# Patient Record
Sex: Male | Born: 1990 | Race: Black or African American | Hispanic: No | Marital: Single
Health system: Southern US, Community
[De-identification: ages and names within clinical notes are randomized; demographics above are authoritative.]

## PROBLEM LIST (undated history)

## (undated) DIAGNOSIS — K3184 Gastroparesis: Secondary | ICD-10-CM

---

## 2020-10-27 ENCOUNTER — Emergency Department (HOSPITAL_COMMUNITY)
Admission: EM | Admit: 2020-10-27 | Discharge: 2020-10-28 | Disposition: A | Discharge status: Home / Self Care | Attending: Emergency Medicine | Admitting: Emergency Medicine

## 2020-10-27 ENCOUNTER — Encounter (HOSPITAL_COMMUNITY): Payer: Self-pay

## 2020-10-27 ENCOUNTER — Other Ambulatory Visit: Payer: Self-pay

## 2020-10-27 ENCOUNTER — Emergency Department (HOSPITAL_COMMUNITY)

## 2020-10-27 DIAGNOSIS — M545 Low back pain, unspecified: Secondary | ICD-10-CM

## 2020-10-27 HISTORY — DX: Gastroparesis: K31.84

## 2020-10-27 MED ORDER — KETOROLAC TROMETHAMINE 15 MG/ML IJ SOLN
30.0000 mg | Freq: Once | INTRAMUSCULAR | Status: AC
  Administered 2020-10-27: 30 mg via INTRAMUSCULAR
  Filled 2020-10-27: qty 2

## 2020-10-27 MED ORDER — LIDOCAINE 5 % EX PTCH
1.0000 | MEDICATED_PATCH | CUTANEOUS | Status: DC
  Administered 2020-10-27: 1 via TRANSDERMAL
  Filled 2020-10-27: qty 1

## 2020-10-27 MED ORDER — ACETAMINOPHEN 500 MG PO TABS
1000.0000 mg | ORAL_TABLET | Freq: Once | ORAL | Status: AC
  Administered 2020-10-27: 1000 mg via ORAL
  Filled 2020-10-27: qty 2

## 2020-10-27 NOTE — ED Triage Notes (Signed)
Pt reports lower back pain that radiates down his left leg shortly after jumping at a trampoline park. Pt reports he has trouble sitting down.

## 2020-10-27 NOTE — ED Provider Notes (Signed)
[name]-EMERGENCY DEPT Provider Note   CSN: [id number] Arrival date & time: 10/27/20  2238     History Chief Complaint  Patient presents with   Back Pain    Jose Ellison is a 30 y.o. male with a history of gastroparesis who presents to the emergency department with complaints of lower back pain for the past few days.  Patient states that he was at the trampoline park with his children and thinks that he overdid it, he had no overt pain while jumping but once he got off of the trampoline since that down he noted discomfort in the lower back.  Pain has been constant, seems to be worse, intermittently radiating down the left lower extremity.  Has some intermittent paresthesias.  Aggravated by certain positions/movements, alleviated some with an over-the-counter back brace he purchased as well as some Aleve. Denies numbness, significant weakness, saddle anesthesia, incontinence to bowel/bladder, fever, chills, IV drug use, dysuria, or hx of cancer. Patient has not had prior back surgeries.    HPI     Past Medical History:  Diagnosis Date   Gastroparesis     There are no problems to display for this patient.   History reviewed. No pertinent surgical history.     History reviewed. No pertinent family history.     Home Medications Prior to Admission medications   Not on File    Allergies    Patient has no known allergies.  Review of Systems   Review of Systems  Constitutional: Negative for chills, fever and unexpected weight change.  Gastrointestinal: Negative for abdominal pain, nausea and vomiting.  Genitourinary: Negative for dysuria.  Musculoskeletal: Positive for back pain.  Neurological: Negative for weakness and numbness.       Negative for saddle anesthesia or bowel/bladder incontinence.   All other systems reviewed and are negative.   Physical Exam Updated Vital Signs BP 136/82 (BP Location: Left Arm)    Pulse 91    Temp 98.6 F  (37 C) (Oral)    Resp 18    Ht 5\' 9"  (1.753 m)    Wt 121.6 kg    SpO2 97%    BMI 39.58 kg/m   Physical Exam Vitals and nursing note reviewed.  Constitutional:      General: He is not in acute distress.    Appearance: He is well-developed and well-nourished. He is not ill-appearing or toxic-appearing.  HENT:     Head: Normocephalic and atraumatic.  Cardiovascular:     Pulses: Intact distal pulses.          Dorsalis pedis pulses are 2+ on the right side and 2+ on the left side.       Posterior tibial pulses are 2+ on the right side and 2+ on the left side.  Pulmonary:     Effort: Pulmonary effort is normal.  Abdominal:     General: There is no distension.     Palpations: Abdomen is soft.     Tenderness: There is no abdominal tenderness. There is no guarding or rebound.  Musculoskeletal:     Cervical back: Normal range of motion and neck supple. No spinous process tenderness or muscular tenderness.     Comments: Back: Diffuse tenderness to the lumbar region including midline and bilateral paraspinal muscles left greater than right.  No point/focal vertebral tenderness or palpable step-off. Lower extremities: No obvious deformity, appreciable swelling, edema, erythema, ecchymosis, warmth, or open wounds. Patient able to actively move all major joints of  the lower extremity.  He is tender to palpation to the left lateral hip as well as the left gluteal region.  Skin:    General: Skin is warm and dry.     Capillary Refill: Capillary refill takes less than 2 seconds.     Findings: No rash.  Neurological:     Mental Status: He is alert.     Deep Tendon Reflexes:     Reflex Scores:      Patellar reflexes are 2+ on the right side and 2+ on the left side.    Comments: Alert. Clear speech. Sensation grossly intact to bilateral lower extremities. 5/5 strength with plantar/dorsiflexion bilaterally. Patient ambulatory with antalgic gait.  Psychiatric:        Mood and Affect: Mood normal.         Behavior: Behavior normal.     ED Results / Procedures / Treatments   Labs (all labs ordered are listed, but only abnormal results are displayed) Labs Reviewed - No data to display  EKG None  Radiology DG Lumbar Spine Complete  Result Date: 10/27/2020 CLINICAL DATA:  Lumbosacral back pain radiating down left leg. Onset after jumping on trampoline park. EXAM: LUMBAR SPINE - COMPLETE 4+ VIEW COMPARISON:  None. FINDINGS: There are 5 non-rib-bearing lumbar vertebra. S1 appears partially lumbarized. The alignment is maintained. Vertebral body heights are normal. There is no listhesis. The posterior elements are intact. Disc spaces are preserved. No fracture. Sacroiliac joints are symmetric and normal. IMPRESSION: 1. No acute fracture or subluxation of the lumbar spine. 2. Partial lumbarization of S1. Electronically Signed   By: Narda Rutherford M.D.   On: 10/27/2020 23:41   DG Hip Unilat With Pelvis 2-3 Views Left  Result Date: 10/27/2020 CLINICAL DATA:  Lumbosacral back pain radiating down left leg. Onset after jumping at trampoline park. EXAM: DG HIP (WITH OR WITHOUT PELVIS) 2-3V LEFT COMPARISON:  None. FINDINGS: The cortical margins of the bony pelvis and left hip are intact. No fracture. Pubic symphysis and sacroiliac joints are congruent. Left hip joint space is preserved. No evidence of a vascular necrosis or focal bone lesion. Soft tissues are unremarkable. IMPRESSION: Negative radiographs of the pelvis and left hip. Electronically Signed   By: Narda Rutherford M.D.   On: 10/27/2020 23:40    Procedures Procedures   Medications Ordered in ED Medications  lidocaine (LIDODERM) 5 % 1 patch (1 patch Transdermal Patch Applied 10/27/20 2339)  ketorolac (TORADOL) 15 MG/ML injection 30 mg (30 mg Intramuscular Given 10/27/20 2337)  acetaminophen (TYLENOL) tablet 1,000 mg (1,000 mg Oral Given 10/27/20 2339)    ED Course  I have reviewed the triage vital signs and the nursing notes.  Pertinent labs  & imaging results that were available during my care of the patient were reviewed by me and considered in my medical decision making (see chart for details).    MDM Rules/Calculators/A&P                         Patient presents to the ED with complaints of back pain.  Nontoxic, vitals WNL.    Additional history obtained:  Additional history obtained from nursing note review.   Imaging Studies ordered:  I ordered imaging studies which included L spine x-ray & right hip x-ray, I independently reviewed, formal radiology impression shows:  L hip: Negative radiographs of the left hip and pelvis L spine: 1. No acute fracture or subluxation of the lumbar spine. 2. Partial  lumbarization of S1  ED Course:  Imaging is without fractures, subluxations, or dislocations.  Patient is afebrile without history of IVDU therefore doubt epidural abscess.  No significant neurologic deficits to raise concern for cord compression or cauda equina syndrome.  No urinary symptoms or abdominal tenderness.  Overall no back pain red flags.  Suspect muscular strain/spasm with degree of radiculopathy, also considering herniated disc.  Will trial steroids, Lidoderm patch, muscle relaxant, PCP follow up. I discussed results, treatment plan, need for follow-up, and return precautions with the patient. Provided opportunity for questions, patient confirmed understanding and is in agreement with plan.   Portions of this note were generated with Scientist, clinical (histocompatibility and immunogenetics). Dictation errors may occur despite best attempts at proofreading.  Final Clinical Impression(s) / ED Diagnoses Final diagnoses:  Acute bilateral low back pain, unspecified whether sciatica present    Rx / DC Orders ED Discharge Orders         Ordered    methocarbamol (ROBAXIN) 500 MG tablet  Every 8 hours PRN        10/28/20 0029    predniSONE (STERAPRED UNI-PAK 21 TAB) 10 MG (21) TBPK tablet  Daily        10/28/20 0029    lidocaine (LIDODERM) 5 %  Daily  PRN        10/28/20 0029           Cherly Anderson, PA-C 10/28/20 0031    Geoffery Lyons, MD 10/28/20 [phone]

## 2020-10-28 MED ORDER — LIDOCAINE 5 % EX PTCH: 1.0000 | MEDICATED_PATCH | Freq: Every day | CUTANEOUS | 0 refills | Status: AC | PRN

## 2020-10-28 MED ORDER — PREDNISONE 10 MG (21) PO TBPK: ORAL_TABLET | Freq: Every day | ORAL | 0 refills | Status: AC

## 2020-10-28 MED ORDER — METHOCARBAMOL 500 MG PO TABS: 500.0000 mg | ORAL_TABLET | Freq: Three times a day (TID) | ORAL | 0 refills | Status: AC | PRN

## 2020-10-28 NOTE — Discharge Instructions (Addendum)
You were seen in the ED today for back pain.  Your x-rays did not show any fractures. We are sending you home with Robaxin to take 1 tablet every 8 hours as needed for muscle pain/spasms.  Do not drive or operate heavy machinery when taking this medication.  Do not take other sedating medications with this.  You may take 2 tablets at night prior to bed.  We are sending you with prednisone which is in a steroid to help with pain and inflammation.  Please take this as prescribed.  Be sure to take this in the morning with food as it can cause stomach upset and it or stomach bleeding.  Please apply 1 Lidoderm patch to area with significant pain once per day as needed.  Remove and discard within 12 hours of application.   We have prescribed you new medication(s) today. Discuss the medications prescribed today with your pharmacist as they can have adverse effects and interactions with your other medicines including over the counter and prescribed medications. Seek medical evaluation if you start to experience new or abnormal symptoms after taking one of these medicines, seek care immediately if you start to experience difficulty breathing, feeling of your throat closing, facial swelling, or rash as these could be indications of a more serious allergic reaction  Please apply heat to affected area.  Please follow-up with primary care within 1 week.  Return to the ER for any new or worsening symptoms including but not limited to numbness, weakness, loss of control of bowel or bladder, inability to walk, fever, or any other concerns.

## 2021-07-08 IMAGING — CR DG LUMBAR SPINE COMPLETE 4+V
5 series · 5 of 5 positions shown · non-contrast
Comparison: None.

CLINICAL DATA: Lumbosacral back pain radiating down left leg. Onset
after jumping on trampoline park.

EXAM:
LUMBAR SPINE - COMPLETE 4+ VIEW

[t lumbar spine ap]
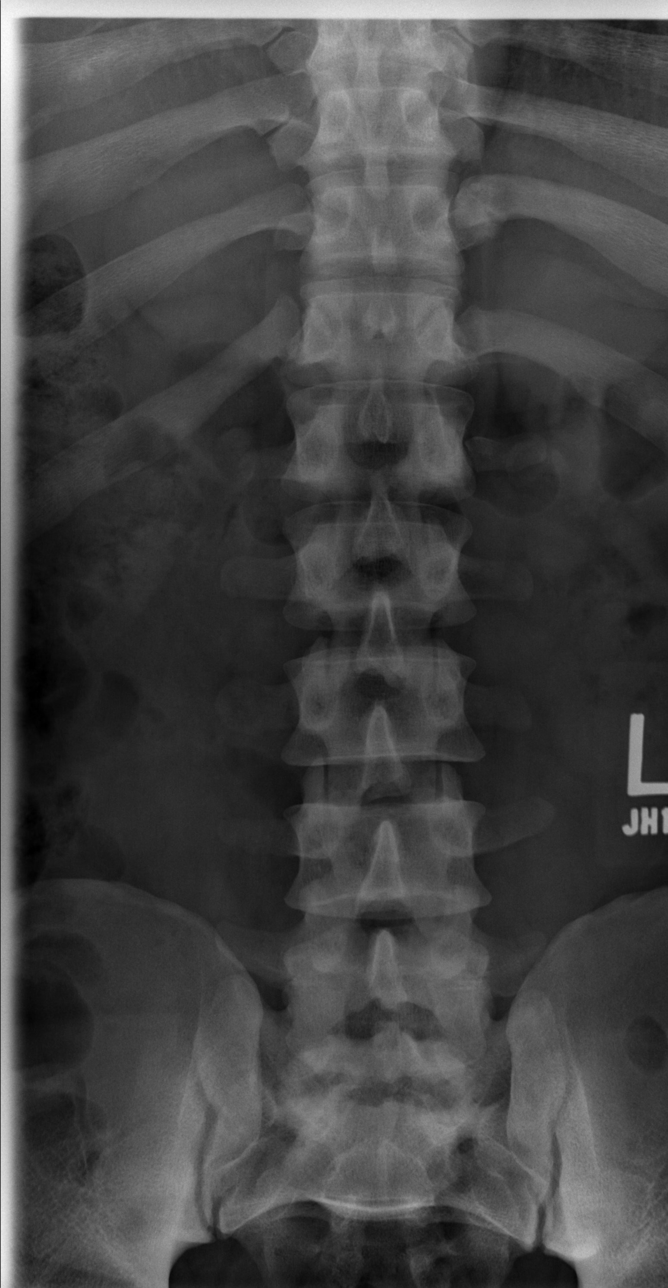

[t lumbar spine obl (1 of 2)]
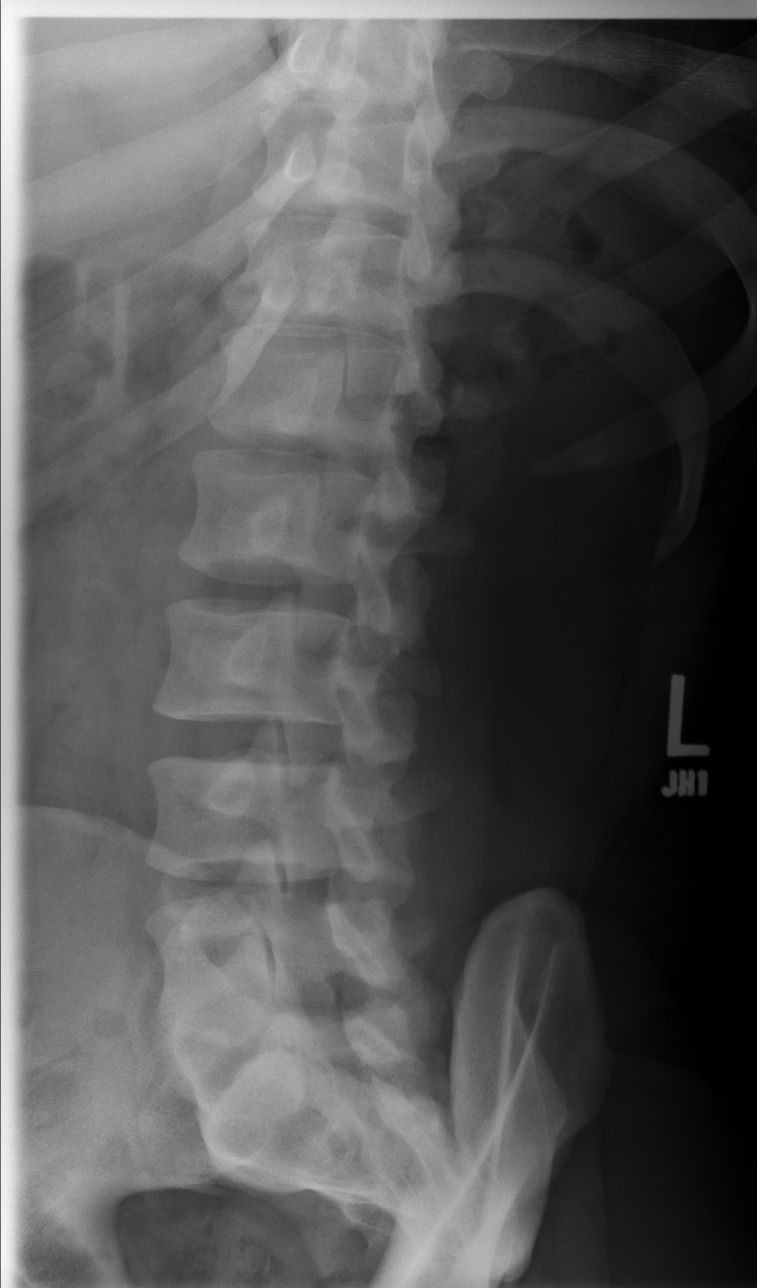

[t lumbar spine obl (2 of 2)]
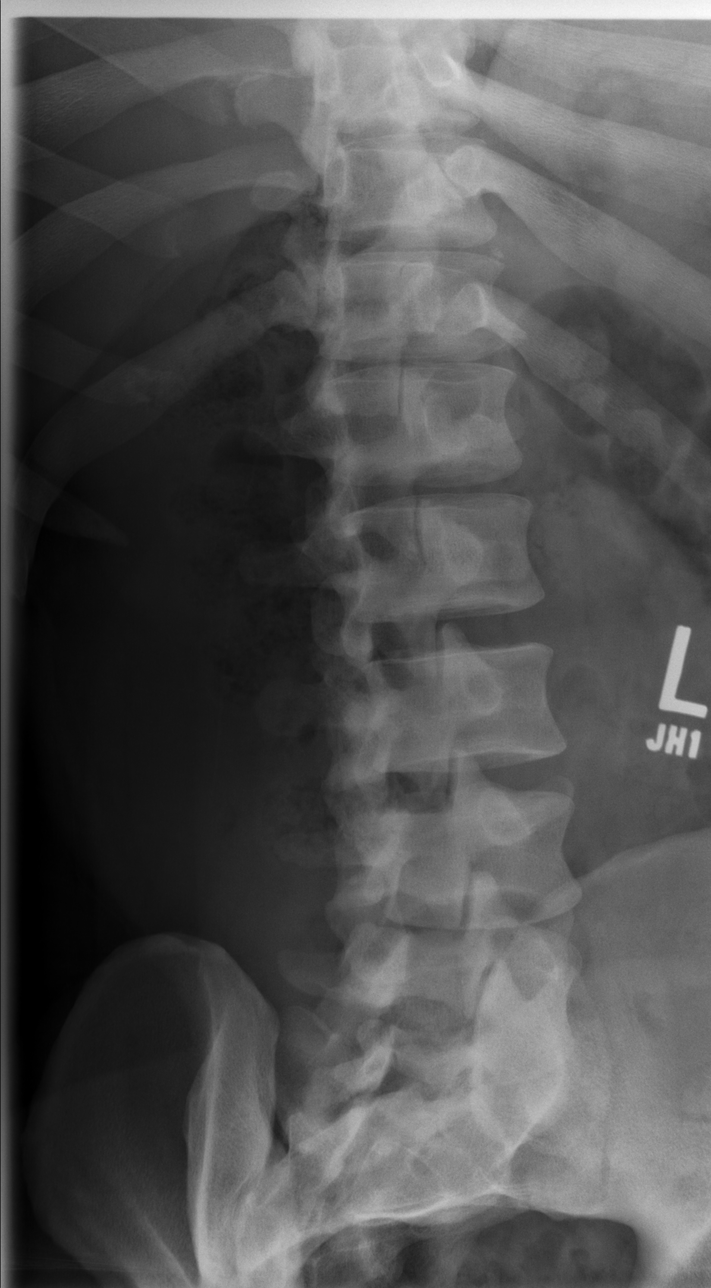

[t lumbar spine lat]
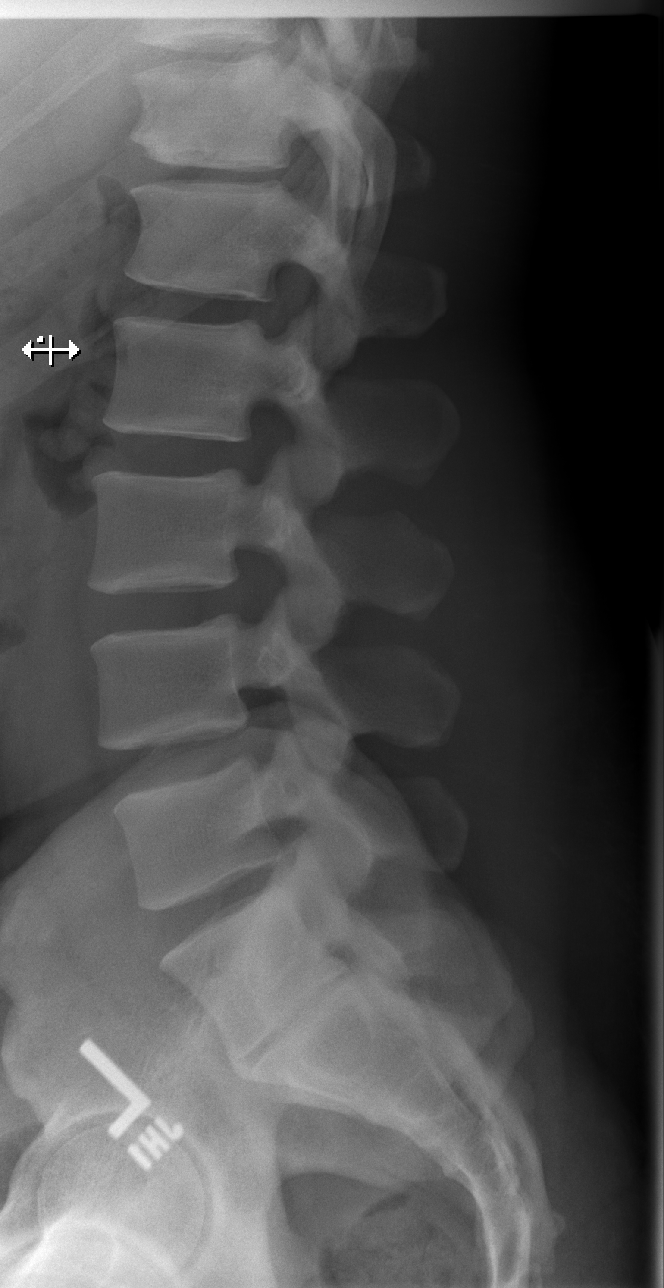

[t lumbar l-5 s-1 spot]
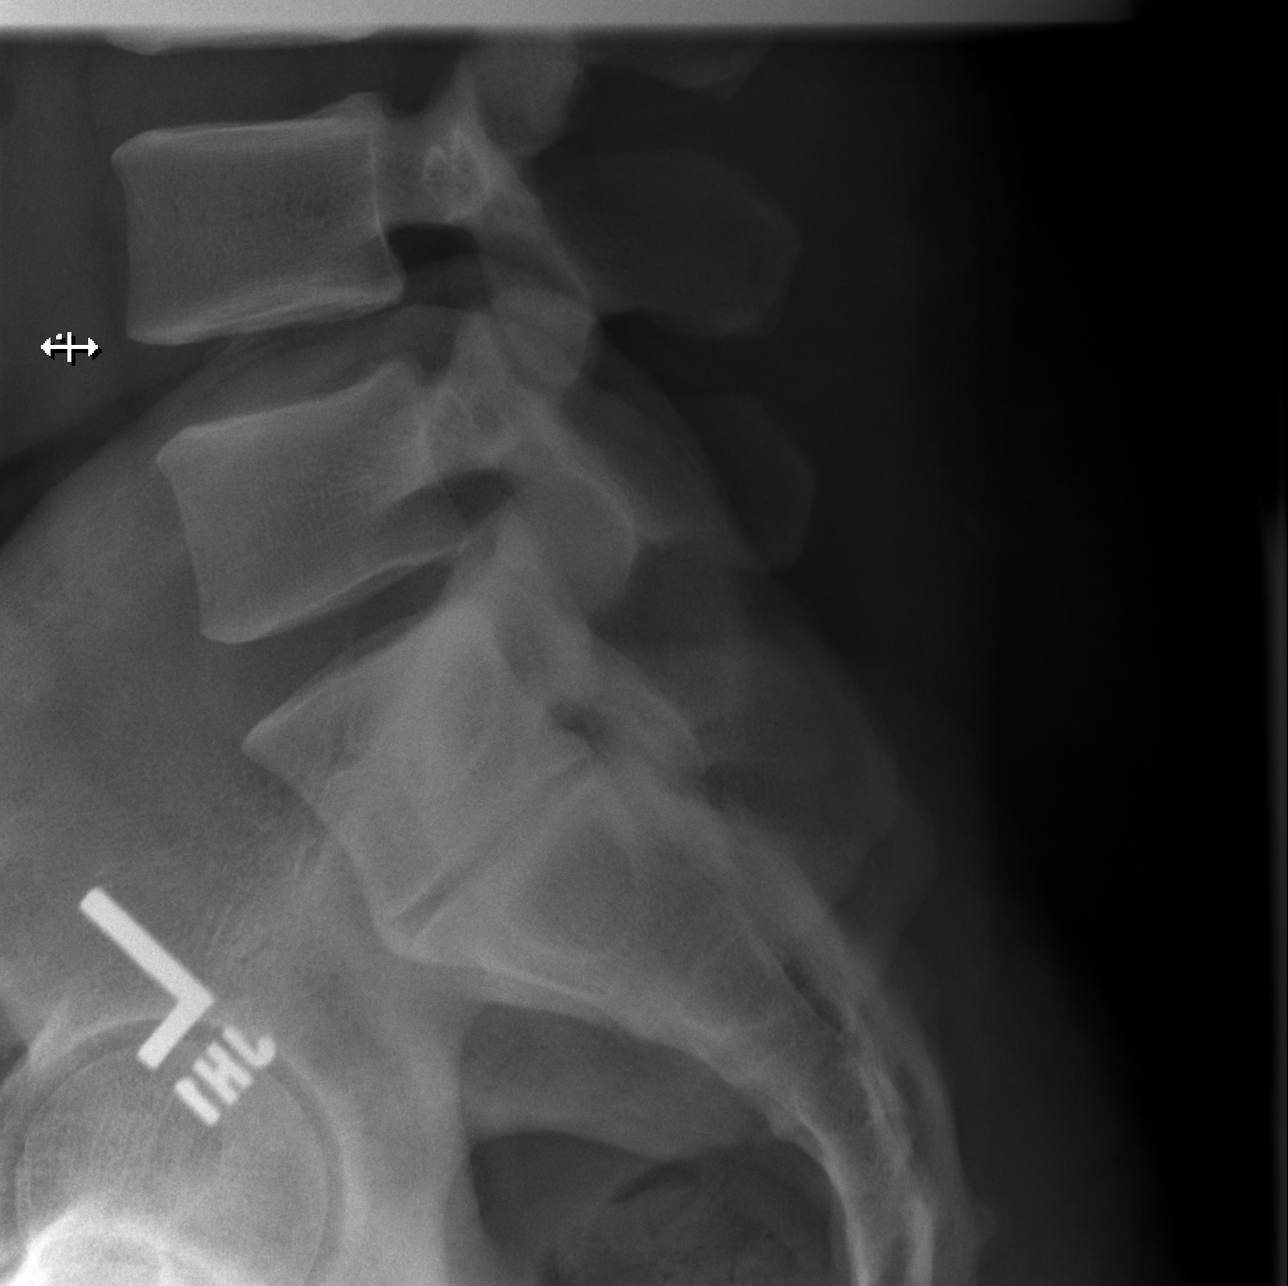

[5 of 5 positions shown; findings below may reference images not displayed]

FINDINGS: There are 5 non-rib-bearing lumbar vertebra. S1 appears partially
lumbarized. The alignment is maintained. Vertebral body heights are
normal. There is no listhesis. The posterior elements are intact.
Disc spaces are preserved. No fracture. Sacroiliac joints are
symmetric and normal.
IMPRESSION: 1. No acute fracture or subluxation of the lumbar spine.
2. Partial lumbarization of S1.
